# Patient Record
Sex: Female | Born: 2000 | Race: White | Hispanic: No | Marital: Single | Smoking: Never smoker
Health system: Southern US, Community
[De-identification: ages and names within clinical notes are randomized; demographics above are authoritative.]

## PROBLEM LIST (undated history)

## (undated) ENCOUNTER — Ambulatory Visit: Admission: EM

## (undated) HISTORY — PX: NO PAST SURGERIES: SHX2092

---
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
## 2021-05-23 ENCOUNTER — Other Ambulatory Visit: Payer: Self-pay

## 2021-05-23 ENCOUNTER — Ambulatory Visit
Admission: EM | Admit: 2021-05-23 | Discharge: 2021-05-23 | Disposition: A | Discharge status: Home / Self Care | Payer: Managed Care, Other (non HMO) | Attending: Family Medicine | Admitting: Family Medicine

## 2021-05-23 ENCOUNTER — Encounter: Payer: Self-pay | Admitting: Emergency Medicine

## 2021-05-23 DIAGNOSIS — Z20822 Contact with and (suspected) exposure to covid-19: Secondary | ICD-10-CM | POA: Insufficient documentation

## 2021-05-23 DIAGNOSIS — J029 Acute pharyngitis, unspecified: Secondary | ICD-10-CM | POA: Insufficient documentation

## 2021-05-23 DIAGNOSIS — R509 Fever, unspecified: Secondary | ICD-10-CM | POA: Diagnosis not present

## 2021-05-23 LAB — POCT RAPID STREP A: Streptococcus, Group A Screen (Direct): NEGATIVE

## 2021-05-23 MED ORDER — NAPROXEN 500 MG PO TABS: 500.0000 mg | ORAL_TABLET | Freq: Two times a day (BID) | ORAL | 0 refills | Status: DC | PRN

## 2021-05-23 NOTE — Discharge Instructions (Signed)
Rest, fluids.  Medication as directed.  Awaiting COVID test results.  Take care  Dr. Adriana Simas

## 2021-05-23 NOTE — ED Triage Notes (Signed)
Pt c/o sore throat, fever (102), cough, nasal congestion. Started about 3 days ago. She has 2 at home covid tests negative.

## 2021-05-24 LAB — SARS CORONAVIRUS 2 (TAT 6-24 HRS): SARS Coronavirus 2: NEGATIVE

## 2021-05-24 NOTE — ED Provider Notes (Signed)
[facility]    CSN: [id number] Arrival date & time: 05/23/21  1642  History   Chief Complaint Chief Complaint  Patient presents with   Fever   Sore Throat    HPI 20 year old female presents for evaluation of the above.  Symptoms for the past 3 days. She reports sore throat, fever (Tmax 102), cough, congestion. No reported sick contacts. No relieving factors. 2 negative home COVID tests. No other associated symptoms. No other complaints.   Home Medications    Prior to Admission medications   Medication Sig Start Date End Date Taking? Authorizing Provider  naproxen (NAPROSYN) 500 MG tablet Take 1 tablet (500 mg total) by mouth 2 (two) times daily as needed for moderate pain. 05/23/21  Yes Andreu Drudge G, DO  ranitidine (ZANTAC) 150 MG capsule Take 1 capsule (150 mg total) by mouth 2 (two) times daily. Prn for the itching 01/05/17   Hassan Rowan, MD    Family History Family History  Problem Relation Age of Onset   Diabetes Mother    Hypothyroidism Sister     Social History Social History   Tobacco Use   Smoking status: Never   Smokeless tobacco: Never  Vaping Use   Vaping Use: Every day  Substance Use Topics   Alcohol use: No   Drug use: No     Allergies   Patient has no known allergies.   Review of Systems Review of Systems  Constitutional:  Positive for fever.  HENT:  Positive for congestion and sore throat.     Physical Exam Triage Vital Signs ED Triage Vitals  Enc Vitals Group     BP 05/23/21 1705 126/80     Pulse Rate 05/23/21 1705 78     Resp 05/23/21 1705 18     Temp 05/23/21 1705 98.4 F (36.9 C)     Temp Source 05/23/21 1705 Oral     SpO2 05/23/21 1705 100 %     Weight 05/23/21 1702 220 lb 0.3 oz (99.8 kg)     Height 05/23/21 1702 5\' 5"  (1.651 m)     Head Circumference --      Peak Flow --      Pain Score 05/23/21 1701 6     Pain Loc --      Pain Edu? --      Excl. in GC? --    No data found.  Updated Vital Signs BP  126/80 (BP Location: Left Arm)   Pulse 78   Temp 98.4 F (36.9 C) (Oral)   Resp 18   Ht 5\' 5"  (1.651 m)   Wt 99.8 kg   LMP 05/02/2021   SpO2 100%   BMI 36.61 kg/m   Visual Acuity Right Eye Distance:   Left Eye Distance:   Bilateral Distance:    Right Eye Near:   Left Eye Near:    Bilateral Near:     Physical Exam Vitals and nursing note reviewed.  Constitutional:      General: She is not in acute distress.    Appearance: She is well-developed. She is not ill-appearing.  HENT:     Head: Normocephalic and atraumatic.     Right Ear: Tympanic membrane normal.     Left Ear: Tympanic membrane normal.     Mouth/Throat:     Pharynx: Posterior oropharyngeal erythema present. No oropharyngeal exudate.  Cardiovascular:     Rate and Rhythm: Normal rate and regular rhythm.  Pulmonary:     Effort:  Pulmonary effort is normal.     Breath sounds: Normal breath sounds. No wheezing or rales.  Neurological:     Mental Status: She is alert.  Psychiatric:     Comments: Flat affect.      UC Treatments / Results  Labs (all labs ordered are listed, but only abnormal results are displayed) Labs Reviewed  SARS CORONAVIRUS 2 (TAT 6-24 HRS)  CULTURE, GROUP A STREP [facility])  POCT RAPID STREP A, ED / UC  POCT RAPID STREP A    EKG   Radiology No results found.  Procedures Procedures (including critical care time)  Medications Ordered in UC Medications - No data to display  Initial Impression / Assessment and Plan / UC Course  I have reviewed the triage vital signs and the nursing notes.  Pertinent labs & imaging results that were available during my care of the patient were reviewed by me and considered in my medical decision making (see chart for details).    20 year old female presents with an acute febrile illness. Strep and COVID testing negative. Outside of window for treatment of influenza, so testing was not pursued. Advised rest and fluids. Naproxen as needed.     Final diagnoses:  Febrile illness     Discharge Instructions      Rest, fluids.  Medication as directed.  Awaiting COVID test results.  Take care  Dr. Adriana Simas      ED Prescriptions     Medication Sig Dispense Auth. Provider   naproxen (NAPROSYN) 500 MG tablet Take 1 tablet (500 mg total) by mouth 2 (two) times daily as needed for moderate pain. 30 tablet Tommie Sams, DO      PDMP not reviewed this encounter.   Tommie Sams, [place] 05/24/21 2258

## 2021-05-26 LAB — CULTURE, GROUP A STREP (THRC)

## 2022-07-16 ENCOUNTER — Ambulatory Visit
Admission: EM | Admit: 2022-07-16 | Discharge: 2022-07-16 | Disposition: A | Discharge status: Home / Self Care | Payer: Managed Care, Other (non HMO) | Attending: Emergency Medicine | Admitting: Emergency Medicine

## 2022-07-16 DIAGNOSIS — Z79899 Other long term (current) drug therapy: Secondary | ICD-10-CM | POA: Insufficient documentation

## 2022-07-16 DIAGNOSIS — J069 Acute upper respiratory infection, unspecified: Secondary | ICD-10-CM

## 2022-07-16 DIAGNOSIS — Z20822 Contact with and (suspected) exposure to covid-19: Secondary | ICD-10-CM | POA: Diagnosis not present

## 2022-07-16 DIAGNOSIS — Z7952 Long term (current) use of systemic steroids: Secondary | ICD-10-CM | POA: Insufficient documentation

## 2022-07-16 LAB — RESP PANEL BY RT-PCR (RSV, FLU A&B, COVID)  RVPGX2
Influenza A by PCR: NEGATIVE
Influenza B by PCR: NEGATIVE
Resp Syncytial Virus by PCR: NEGATIVE
SARS Coronavirus 2 by RT PCR: NEGATIVE

## 2022-07-16 MED ORDER — PREDNISONE 20 MG PO TABS: 40.0000 mg | ORAL_TABLET | Freq: Every day | ORAL | 0 refills | Status: DC

## 2022-07-16 MED ORDER — IPRATROPIUM-ALBUTEROL 0.5-2.5 (3) MG/3ML IN SOLN
3.0000 mL | Freq: Once | RESPIRATORY_TRACT | Status: AC
  Administered 2022-07-16: 3 mL via RESPIRATORY_TRACT

## 2022-07-16 MED ORDER — AZITHROMYCIN 250 MG PO TABS: 250.0000 mg | ORAL_TABLET | Freq: Every day | ORAL | 0 refills | Status: DC

## 2022-07-16 MED ORDER — ALBUTEROL SULFATE HFA 108 (90 BASE) MCG/ACT IN AERS: 2.0000 | INHALATION_SPRAY | RESPIRATORY_TRACT | 0 refills | Status: AC | PRN

## 2022-07-16 NOTE — Discharge Instructions (Addendum)
You are being treated for upper respiratory infection which is most likely from a germ that you got from one of the children  COVID, flu and RSV testing negative  Take azithromycin as directed, this will provide bacterial coverage  Take prednisone every morning with food for the next 5 days, this medicine will reduce any inflammation and irritation to the airways which will help you breathe better  You may take 2 puffs of your albuterol inhaler every 4-6 hours as needed for wheezing and shortness of breath, this medicine is similar to the treatment that you received in office  You can take Tylenol and/or Ibuprofen as needed for fever reduction and pain relief.   For cough: honey 1/2 to 1 teaspoon (you can dilute the honey in water or another fluid).  You can also use guaifenesin and dextromethorphan for cough. You can use a humidifier for chest congestion and cough.  If you don't have a humidifier, you can sit in the bathroom with the hot shower running.      For sore throat: try warm salt water gargles, cepacol lozenges, throat spray, warm tea or water with lemon/honey, popsicles or ice, or OTC cold relief medicine for throat discomfort.   For congestion: take a daily anti-histamine like Zyrtec, Claritin, and a oral decongestant, such as pseudoephedrine.  You can also use Flonase 1-2 sprays in each nostril daily.   It is important to stay hydrated: drink plenty of fluids (water, gatorade/powerade/pedialyte, juices, or teas) to keep your throat moisturized and help further relieve irritation/discomfort.

## 2022-07-16 NOTE — ED Triage Notes (Signed)
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

## 2022-07-16 NOTE — ED Provider Notes (Signed)
[facility]    CSN: [id number] Arrival date & time: 07/16/22  4166      History   Chief Complaint Chief Complaint  Patient presents with   Wheezing   Nasal Congestion   Shortness of Breath    HPI Sarah Finley is a 21 y.o. female.   Patient presents with chills, nasal congestion, rhinorrhea, left-sided ear pressure, sore throat, productive cough, shortness of breath at rest worsened by exertion and wheezing for 4 days.  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  Tolerating food and liquids.  No pertinent medical history.  Has attempted use of pseudoephedrine cold and flu mix which has been moderately helpful using sudafed,.  Daily vaping.  History reviewed. No pertinent past medical history.  There are no problems to display for this patient.   Past Surgical History:  Procedure Laterality Date   NO PAST SURGERIES      OB History   No obstetric history on file.      Home Medications    Prior to Admission medications   Medication Sig Start Date End Date Taking? Authorizing Provider  naproxen (NAPROSYN) 500 MG tablet Take 1 tablet (500 mg total) by mouth 2 (two) times daily as needed for moderate pain. 05/23/21   Tommie Sams, DO  ranitidine (ZANTAC) 150 MG capsule Take 1 capsule (150 mg total) by mouth 2 (two) times daily. Prn for the itching 01/05/17   Hassan Rowan, MD    Family History Family History  Problem Relation Age of Onset   Diabetes Mother    Hypothyroidism Sister     Social History Social History   Tobacco Use   Smoking status: Never   Smokeless tobacco: Never  Vaping Use   Vaping Use: Every day  Substance Use Topics   Alcohol use: No   Drug use: No     Allergies   Patient has no known allergies.   Review of Systems Review of Systems  Constitutional:  Positive for chills. Negative for activity change, appetite change, diaphoresis, fatigue, fever and unexpected weight change.  HENT:  Positive for  congestion, ear pain, rhinorrhea and sore throat. Negative for dental problem, drooling, ear discharge, facial swelling, hearing loss, mouth sores, nosebleeds, postnasal drip, sinus pressure, sinus pain, sneezing, tinnitus, trouble swallowing and voice change.   Respiratory:  Positive for cough, shortness of breath and wheezing. Negative for apnea, choking, chest tightness and stridor.   Skin: Negative.      Physical Exam Triage Vital Signs ED Triage Vitals  Enc Vitals Group     BP 07/16/22 0906 (!) 124/92     Pulse Rate 07/16/22 0906 96     Resp --      Temp 07/16/22 0906 98.5 F (36.9 C)     Temp Source 07/16/22 0906 Oral     SpO2 07/16/22 0906 95 %     Weight 07/16/22 0905 225 lb (102.1 kg)     Height 07/16/22 0905 5\' 6"  (1.676 m)     Head Circumference --      Peak Flow --      Pain Score 07/16/22 0905 0     Pain Loc --      Pain Edu? --      Excl. in GC? --    No data found.  Updated Vital Signs BP (!) 124/92 (BP Location: Left Arm)   Pulse 96   Temp 98.5 F (36.9 C) (Oral)   Ht 5'  6" (1.676 m)   Wt 225 lb (102.1 kg)   LMP 07/13/2022 (Exact Date)   SpO2 95%   BMI 36.32 kg/m   Visual Acuity Right Eye Distance:   Left Eye Distance:   Bilateral Distance:    Right Eye Near:   Left Eye Near:    Bilateral Near:     Physical Exam Constitutional:      Appearance: Normal appearance.  HENT:     Head: Normocephalic.     Right Ear: Tympanic membrane, ear canal and external ear normal.     Left Ear: Tympanic membrane, ear canal and external ear normal.     Nose: Congestion and rhinorrhea present.     Mouth/Throat:     Mouth: Mucous membranes are moist.     Pharynx: Oropharynx is clear.  Eyes:     Extraocular Movements: Extraocular movements intact.  Cardiovascular:     Rate and Rhythm: Normal rate and regular rhythm.     Pulses: Normal pulses.     Heart sounds: Normal heart sounds.  Pulmonary:     Effort: Pulmonary effort is normal.     Breath sounds:  Wheezing present.  Skin:    General: Skin is warm and dry.  Neurological:     Mental Status: She is alert and oriented to person, place, and time. Mental status is at baseline.  Psychiatric:        Mood and Affect: Mood normal.        Behavior: Behavior normal.      UC Treatments / Results  Labs (all labs ordered are listed, but only abnormal results are displayed) Labs Reviewed  RESP PANEL BY RT-PCR (FLU A&B, COVID) [id number]    EKG   Radiology No results found.  Procedures Procedures (including critical care time)  Medications Ordered in UC Medications - No data to display  Initial Impression / Assessment and Plan / UC Course  I have reviewed the triage vital signs and the nursing notes.  Pertinent labs & imaging results that were available during my care of the patient were reviewed by me and considered in my medical decision making (see chart for details).  Upper respiratory infection  COVID, flu and RSV negative, discussed with patient vital signs are stable and patient is in no signs of distress, wheezing heard to auscultation on initial exam, DuoNeb given in office and lungs are now clear and patient endorses that she is beginning to feel better, etiology is symptoms are most likely viral but as they have progressed rapidly over the last 4 days with respiratory discomfort we will provide bacterial coverage, Z-Pak, prednisone and albuterol inhaler prescribed for outpatient use, may use additional over-the-counter medications as needed for support, given strict precautions to follow-up with urgent care if symptoms persist or worsen, work note given  Final Clinical Impressions(s) / UC Diagnoses   Final diagnoses:  None   Discharge Instructions   None    ED Prescriptions   None    PDMP not reviewed this encounter.   Valinda Hoar, NP 07/16/22 1017

## 2023-09-22 ENCOUNTER — Ambulatory Visit
Admission: EM | Admit: 2023-09-22 | Discharge: 2023-09-22 | Disposition: A | Discharge status: Home / Self Care | Payer: BC Managed Care – PPO | Attending: Family Medicine | Admitting: Family Medicine

## 2023-09-22 DIAGNOSIS — J069 Acute upper respiratory infection, unspecified: Secondary | ICD-10-CM | POA: Insufficient documentation

## 2023-09-22 LAB — GROUP A STREP BY PCR: Group A Strep by PCR: NOT DETECTED

## 2023-09-22 MED ORDER — LIDOCAINE VISCOUS HCL 2 % MT SOLN: 15.0000 mL | OROMUCOSAL | 0 refills | Status: AC | PRN

## 2023-09-22 NOTE — Discharge Instructions (Signed)
Your strep test is negative and your home COVID test was negative.  You can take Tylenol and/or Ibuprofen as needed for fever reduction and pain relief.    For cough: honey 1/2 to 1 teaspoon (you can dilute the honey in water or another fluid).  You can also use guaifenesin and dextromethorphan for cough. You can use a humidifier for chest congestion and cough.  If you don't have a humidifier, you can sit in the bathroom with the hot shower running.      For sore throat: try warm salt water gargles, Mucinex sore throat cough drops or cepacol lozenges, throat spray, warm tea or water with lemon/honey, popsicles or ice, or OTC cold relief medicine for throat discomfort. You can also purchase chloraseptic spray at the pharmacy or dollar store.   For congestion: take a daily anti-histamine like Zyrtec, Claritin, and a oral decongestant, such as pseudoephedrine.  You can also use Flonase 1-2 sprays in each nostril daily. Afrin is also a good option, if you do not have high blood pressure.    It is important to stay hydrated: drink plenty of fluids (water, gatorade/powerade/pedialyte, juices, or teas) to keep your throat moisturized and help further relieve irritation/discomfort.    Return or go to the Emergency Department if symptoms worsen or do not improve in the next few days

## 2023-09-22 NOTE — ED Triage Notes (Signed)
Fever-sore throat-x 3 days  Bodyaches started yesterday. Patient has been taking day and nyquil.

## 2023-09-22 NOTE — ED Provider Notes (Signed)
[facility]    CSN: [id number] Arrival date & time: 09/22/23  0915      History   Chief Complaint Chief Complaint  Patient presents with   Fever   Sore Throat   Generalized Body Aches    HPI Sarah Finley is a 22 y.o. female.   HPI  History obtained from the patient. Sarah Finley presents for fever, nasal congestion, sore throat and body aches for 3 days. Tmax 99.9 F after taking DayQuil.  DayQuil has not helped.  COVID test at home yesterday was negative. Denies vomiting and diarrhea. Endorses headache and abdominal pain. No known sick contacts. No personal history of asthma.        History reviewed. No pertinent past medical history.  There are no problems to display for this patient.   Past Surgical History:  Procedure Laterality Date   NO PAST SURGERIES      OB History   No obstetric history on file.      Home Medications    Prior to Admission medications   Medication Sig Start Date End Date Taking? Authorizing Provider  lidocaine (XYLOCAINE) 2 % solution Use as directed 15 mLs in the mouth or throat every 4 (four) hours as needed for mouth pain. 09/22/23  Yes Sakai Heinle, DO  albuterol (VENTOLIN HFA) 108 (90 Base) MCG/ACT inhaler Inhale 2 puffs into the lungs every 4 (four) hours as needed for wheezing or shortness of breath. 07/16/22   White, Elita Boone, NP  ranitidine (ZANTAC) 150 MG capsule Take 1 capsule (150 mg total) by mouth 2 (two) times daily. Prn for the itching 01/05/17   Hassan Rowan, MD    Family History Family History  Problem Relation Age of Onset   Diabetes Mother    Hypothyroidism Sister     Social History Social History   Tobacco Use   Smoking status: Never   Smokeless tobacco: Never  Vaping Use   Vaping status: Every Day  Substance Use Topics   Alcohol use: No   Drug use: No     Allergies   Patient has no known allergies.   Review of Systems Review of Systems: negative unless otherwise stated in  HPI.      Physical Exam Triage Vital Signs ED Triage Vitals  Encounter Vitals Group     BP 09/22/23 0959 (!) 145/82     Systolic BP Percentile --      Diastolic BP Percentile --      Pulse Rate 09/22/23 0959 97     Resp 09/22/23 0959 18     Temp 09/22/23 0959 98.9 F (37.2 C)     Temp Source 09/22/23 0959 Oral     SpO2 09/22/23 0959 97 %     Weight --      Height --      Head Circumference --      Peak Flow --      Pain Score 09/22/23 0958 10     Pain Loc --      Pain Education --      Exclude from Growth Chart --    No data found.  Updated Vital Signs BP (!) 145/82 (BP Location: Right Arm)   Pulse 97   Temp 98.9 F (37.2 C) (Oral)   Resp 18   LMP 08/22/2023 (Approximate)   SpO2 97%   Visual Acuity Right Eye Distance:   Left Eye Distance:   Bilateral Distance:    Right Eye Near:   Left  Eye Near:    Bilateral Near:     Physical Exam GEN:     alert, ill but non-toxic appearing female in no distress    HENT:  mucus membranes moist, oropharyngeal without lesions, mild erythema, +1 tonsillar hypertrophy or exudates, clear nasal discharge, bilateral TM normal EYES:   pupils equal and reactive, no scleral injection or discharge NECK:  normal ROM,no meningismus   RESP:  no increased work of breathing, clear to auscultation bilaterally CVS:   regular rate and rhythm Skin:   warm and dry    UC Treatments / Results  Labs (all labs ordered are listed, but only abnormal results are displayed) Labs Reviewed  GROUP A STREP BY PCR    EKG   Radiology No results found.  Procedures Procedures (including critical care time)  Medications Ordered in UC Medications - No data to display  Initial Impression / Assessment and Plan / UC Course  I have reviewed the triage vital signs and the nursing notes.  Pertinent labs & imaging results that were available during my care of the patient were reviewed by me and considered in my medical decision making (see chart for  details).       Pt is a 22 y.o. female who presents for 3 days of respiratory symptoms. [place] is afebrile here. Satting well on room air. Overall pt is ill but non-toxic appearing, well hydrated, without respiratory distress. Pulmonary exam is unremarkable.  Home COVID testing and was negative. Strep pcr is negative. Marland Kitchen   History consistent with viral respiratory illness. Discussed symptomatic treatment.  Explained lack of efficacy of antibiotics in viral disease.  Typical duration of symptoms discussed.  Lidocaine solution for throat pain.  Return and ED precautions given and voiced understanding. Discussed MDM, treatment plan and plan for follow-up with patient who agrees with plan.     Final Clinical Impressions(s) / UC Diagnoses   Final diagnoses:  Viral upper respiratory tract infection     Discharge Instructions      Your strep test is negative and your home COVID test was negative.  You can take Tylenol and/or Ibuprofen as needed for fever reduction and pain relief.    For cough: honey 1/2 to 1 teaspoon (you can dilute the honey in water or another fluid).  You can also use guaifenesin and dextromethorphan for cough. You can use a humidifier for chest congestion and cough.  If you don't have a humidifier, you can sit in the bathroom with the hot shower running.      For sore throat: try warm salt water gargles, Mucinex sore throat cough drops or cepacol lozenges, throat spray, warm tea or water with lemon/honey, popsicles or ice, or OTC cold relief medicine for throat discomfort. You can also purchase chloraseptic spray at the pharmacy or dollar store.   For congestion: take a daily anti-histamine like Zyrtec, Claritin, and a oral decongestant, such as pseudoephedrine.  You can also use Flonase 1-2 sprays in each nostril daily. Afrin is also a good option, if you do not have high blood pressure.    It is important to stay hydrated: drink plenty of fluids (water,  gatorade/powerade/pedialyte, juices, or teas) to keep your throat moisturized and help further relieve irritation/discomfort.    Return or go to the Emergency Department if symptoms worsen or do not improve in the next few days       ED Prescriptions     Medication Sig Dispense Auth. Provider   lidocaine (XYLOCAINE)  2 % solution Use as directed 15 mLs in the mouth or throat every 4 (four) hours as needed for mouth pain. 100 mL Katha Cabal, DO      PDMP not reviewed this encounter.   Katha Cabal, DO 09/22/23 1047
